# Patient Record
Sex: Female | Born: 1975 | Hispanic: No | Smoking: Never smoker
Health system: Southern US, Community
[De-identification: ages and names within clinical notes are randomized; demographics above are authoritative.]

## PROBLEM LIST (undated history)

## (undated) DIAGNOSIS — R87629 Unspecified abnormal cytological findings in specimens from vagina: Secondary | ICD-10-CM

## (undated) HISTORY — PX: SHOULDER SURGERY: SHX246

## (undated) HISTORY — DX: Unspecified abnormal cytological findings in specimens from vagina: R87.629

## (undated) HISTORY — PX: AUGMENTATION MAMMAPLASTY: SUR837

---

## 1999-10-15 ENCOUNTER — Emergency Department (HOSPITAL_COMMUNITY): Admission: EM | Admit: 1999-10-15 | Discharge: 1999-10-15 | Payer: Self-pay | Admitting: Emergency Medicine

## 2007-04-14 ENCOUNTER — Encounter: Admission: RE | Admit: 2007-04-14 | Discharge: 2007-04-14 | Payer: Self-pay | Admitting: Family Medicine

## 2010-09-27 ENCOUNTER — Other Ambulatory Visit (HOSPITAL_COMMUNITY): Payer: Self-pay | Admitting: Orthopedic Surgery

## 2010-09-27 DIAGNOSIS — M25512 Pain in left shoulder: Secondary | ICD-10-CM

## 2010-10-04 ENCOUNTER — Ambulatory Visit (HOSPITAL_COMMUNITY)
Admission: RE | Admit: 2010-10-04 | Discharge: 2010-10-04 | Disposition: A | Discharge status: Home / Self Care | Payer: Self-pay | Source: Ambulatory Visit | Attending: Orthopedic Surgery | Admitting: Orthopedic Surgery

## 2010-10-04 DIAGNOSIS — X58XXXA Exposure to other specified factors, initial encounter: Secondary | ICD-10-CM | POA: Insufficient documentation

## 2010-10-04 DIAGNOSIS — S46819A Strain of other muscles, fascia and tendons at shoulder and upper arm level, unspecified arm, initial encounter: Secondary | ICD-10-CM | POA: Insufficient documentation

## 2010-10-04 DIAGNOSIS — M25512 Pain in left shoulder: Secondary | ICD-10-CM

## 2010-10-04 DIAGNOSIS — M25519 Pain in unspecified shoulder: Secondary | ICD-10-CM | POA: Insufficient documentation

## 2010-10-04 DIAGNOSIS — M6250 Muscle wasting and atrophy, not elsewhere classified, unspecified site: Secondary | ICD-10-CM | POA: Insufficient documentation

## 2010-10-04 MED ORDER — IOHEXOL 300 MG/ML  SOLN
5.0000 mL | Freq: Once | INTRAMUSCULAR | Status: AC | PRN
  Administered 2010-10-04: 5 mL via INTRAVENOUS

## 2010-10-04 MED ORDER — GADOBENATE DIMEGLUMINE 529 MG/ML IV SOLN
0.0500 mL | Freq: Once | INTRAVENOUS | Status: AC | PRN
  Administered 2010-10-04: 0.05 mL via INTRAVENOUS

## 2011-01-18 ENCOUNTER — Ambulatory Visit (HOSPITAL_BASED_OUTPATIENT_CLINIC_OR_DEPARTMENT_OTHER)
Admission: RE | Admit: 2011-01-18 | Discharge: 2011-01-18 | Disposition: A | Discharge status: Home / Self Care | Payer: Self-pay | Source: Ambulatory Visit | Attending: Orthopedic Surgery | Admitting: Orthopedic Surgery

## 2011-01-18 DIAGNOSIS — M25819 Other specified joint disorders, unspecified shoulder: Secondary | ICD-10-CM | POA: Insufficient documentation

## 2011-01-18 DIAGNOSIS — M67919 Unspecified disorder of synovium and tendon, unspecified shoulder: Secondary | ICD-10-CM | POA: Insufficient documentation

## 2011-01-18 DIAGNOSIS — M719 Bursopathy, unspecified: Secondary | ICD-10-CM | POA: Insufficient documentation

## 2011-01-18 DIAGNOSIS — Z01812 Encounter for preprocedural laboratory examination: Secondary | ICD-10-CM | POA: Insufficient documentation

## 2011-01-18 LAB — POCT HEMOGLOBIN-HEMACUE: Hemoglobin: 15.6 g/dL — ABNORMAL HIGH (ref 12.0–15.0)

## 2011-01-30 NOTE — Op Note (Signed)
NAMEMarland Kitchen  PHUNG, KOTAS NO.:  [id number]  MEDICAL RECORD NO.:  [id number]  LOCATION:                                 FACILITY:  PHYSICIAN:  Elana Alm. Thurston Hole, M.D. DATE OF BIRTH:  15-Jan-1976  DATE OF PROCEDURE:  01/18/2011 DATE OF DISCHARGE:                              OPERATIVE REPORT   PREOPERATIVE DIAGNOSES: 1. Left shoulder rotator cuff tear. 2. Left shoulder partial labrum tear. 3. Left shoulder impingement.  POSTOPERATIVE DIAGNOSES: 1. Left shoulder rotator cuff tear. 2. Left shoulder partial labrum tear. 3. Left shoulder impingement.  PROCEDURE: 1. Left shoulder exam under anesthesia, followed by arthroscopically     assisted rotator cuff repair using Arthrex suture anchor x1 plus     PushLock anchor x1. 2. Left shoulder partial labrum debridement. 3. Left shoulder subacromial decompression.  SURGEON:  Elana Alm. Thurston Hole, MD  ASSISTANT:  Julien Girt, PA-C  ANESTHESIA:  General.OPERATIVE TIME:  1 hour.  COMPLICATIONS:  None.  INDICATIONS FOR PROCEDURE:  Ms. Thaker is a 35 year old woman who has had significant left shoulder pain for the past 6 months increasing in nature with exam and MRI documenting rotator cuff tear with partial labrum tear and impingement.  She has failed conservative care and is now to undergo arthroscopy and repair.  DESCRIPTION:  Ms. Rankin was brought to the operating room on January 18, 2011, after interscalene block was placed in the holding room by Anesthesia.  She was placed on the operative table in supine position. She received Ancef 1 g IV preoperatively for prophylaxis.  After being placed under general anesthesia, her left shoulder was examined.  She had full range of motion.  Her shoulder was stable with ligamentous exam.  She was then placed in beach-chair position.  Her shoulder and arm were prepped using sterile DuraPrep and draped using sterile technique.  Time-out procedure was called and the  correct left shoulder identified.  Initially through a posterior arthroscopic portal, the arthroscope with a pump attached was placed and through an anterior portal, an arthroscopic probe was placed.  On initial inspection, the articular cartilage and glenohumeral joint was intact.  She had a small partial tear 25% in the anterior labrum which was debrided.  The anterior-inferior labrum and anterior-inferior glenohumeral ligament complex was intact.  Biceps tendon anchor and biceps tendon was intact. Posterior labrum was intact.  Rotator cuff showed a significant tear of supraspinatus.  This was partially debrided arthroscopically.  The rest of the rotator cuff was intact.  Inferior capsular recess was free of pathology.  Subacromial space was entered and a lateral arthroscopic portal was made.  Moderately thickened bursitis was resected. Impingement was noted and a subacromial decompression was carried out removing 6-8 mm of the undersurface of the anterior, anterolateral, and anteromedial acromion and CA ligament release carried out as well.  The [facility] joint was not pathologic and thus was not resected.  At this point, the rotator cuff was repaired primarily through 2 accessory lateral portals, first with an Arthrex 5.5-mm suture anchor placed in the greater tuberosity.  Each of the sutures in this anchor were passed arthroscopically through the rotator cuff tear and then tied down with mattress suture  technique with firm and tight fixation.  Further fixation was then achieved with a Arthrex PushLock anchor in the lateral aspect of the greater tuberosity.  This completely repaired the rotator cuff down back with firm and tight fixation.  Shoulder could be brought through full range of motion with no impingement on the rotator cuff repair after this was done.  At this point, it was felt that all pathology had been satisfactorily addressed.  The instruments were removed.  Portals were closed  with 3-0 nylon suture.  Sterile dressings and a sling applied and the patient awakened and taken to recovery room in stable condition.  FOLLOWUP CARE:  Ms. Tritch will be followed as an outpatient on Percocet with early physical therapy.  I will see her back in the office in a week for sutures out and followup.     Jamilett Ferrante A. Thurston Hole, M.D.     RAW/MEDQ  D:  01/18/2011  T:  01/19/2011  Job:  [id number]  Electronically Signed by Salvatore Marvel M.D. on 01/30/2011 02:58:47 PM

## 2017-01-30 ENCOUNTER — Other Ambulatory Visit: Payer: Self-pay | Admitting: Obstetrics & Gynecology

## 2017-01-30 DIAGNOSIS — Z1231 Encounter for screening mammogram for malignant neoplasm of breast: Secondary | ICD-10-CM

## 2018-04-27 ENCOUNTER — Encounter (HOSPITAL_COMMUNITY): Payer: Self-pay

## 2018-05-02 ENCOUNTER — Telehealth (HOSPITAL_COMMUNITY): Payer: Self-pay | Admitting: Obstetrics and Gynecology

## 2018-05-02 NOTE — Telephone Encounter (Signed)
Called patient and left message regarding scheduling screening mammogram.  °

## 2018-05-11 ENCOUNTER — Other Ambulatory Visit (HOSPITAL_COMMUNITY): Payer: Self-pay | Admitting: *Deleted

## 2018-05-11 ENCOUNTER — Telehealth (HOSPITAL_COMMUNITY): Payer: Self-pay

## 2018-05-11 DIAGNOSIS — Z1231 Encounter for screening mammogram for malignant neoplasm of breast: Secondary | ICD-10-CM

## 2018-05-11 NOTE — Telephone Encounter (Signed)
Let message with patient to call back to get scheduled for a [facility] appointment.

## 2018-06-28 ENCOUNTER — Ambulatory Visit (HOSPITAL_COMMUNITY)
Admission: RE | Admit: 2018-06-28 | Discharge: 2018-06-28 | Disposition: A | Discharge status: Home / Self Care | Payer: Self-pay | Source: Ambulatory Visit | Attending: Obstetrics and Gynecology | Admitting: Obstetrics and Gynecology

## 2018-06-28 ENCOUNTER — Other Ambulatory Visit (HOSPITAL_COMMUNITY): Payer: Self-pay | Admitting: Obstetrics and Gynecology

## 2018-06-28 ENCOUNTER — Encounter (HOSPITAL_COMMUNITY): Payer: Self-pay

## 2018-06-28 ENCOUNTER — Ambulatory Visit
Admission: RE | Admit: 2018-06-28 | Discharge: 2018-06-28 | Disposition: A | Discharge status: Home / Self Care | Payer: No Typology Code available for payment source | Source: Ambulatory Visit | Attending: Obstetrics and Gynecology | Admitting: Obstetrics and Gynecology

## 2018-06-28 VITALS — BP 110/74 | Wt 147.0 lb

## 2018-06-28 DIAGNOSIS — Z1231 Encounter for screening mammogram for malignant neoplasm of breast: Secondary | ICD-10-CM

## 2018-06-28 DIAGNOSIS — R87610 Atypical squamous cells of undetermined significance on cytologic smear of cervix (ASC-US): Secondary | ICD-10-CM

## 2018-06-28 DIAGNOSIS — Z1239 Encounter for other screening for malignant neoplasm of breast: Secondary | ICD-10-CM

## 2018-06-28 DIAGNOSIS — R87810 Cervical high risk human papillomavirus (HPV) DNA test positive: Secondary | ICD-10-CM

## 2018-06-28 NOTE — Progress Notes (Signed)
Patient referred to [facility] by the [facility] Department due to having an abnormal Pap smear on 04/27/2018 that a colposcopy is recommended for follow-up.  Pap Smear: Pap smear not completed today. Last Pap smear was 04/27/2018 at the [facility] and ASCUS with positive HPV. Referred [facility] to the [facility] for a colposcopy to follow-up for her abnormal Pap smear. Appointment scheduled for Thursday, July 12, 2018 at 1355. Per patient has a history of an abnormal Pap smear when she was 61 or 43 years old that she thinks a colposcopy was completed for follow-up. Per patient all Pap smears have been normal since the colposcopy was completed. Last Pap smear result is in Epic.  Physical exam: Breasts Breasts symmetrical. No skin abnormalities bilateral breasts. No nipple retraction bilateral breasts. No nipple discharge bilateral breasts. No lymphadenopathy. No lumps palpated bilateral breasts. No complaints of pain or tenderness on exam. Referred patient to the [facility] for a screening mammogram. Appointment scheduled for Thursday, June 28, 2018 at 1440.        Pelvic/Bimanual No Pap smear completed today since last Pap smear and HPV typing was 04/27/2018. Pap smear not indicated per [facility] guidelines.   Smoking History: Patient has never smoked.  Patient Navigation: Patient education provided. Access to services provided for patient through [facility] program.   Breast and Cervical Cancer Risk Assessment: Patient has no family history of breast cancer, known genetic mutations, or radiation treatment to the chest before age 46. Per patient has a history of cervical dysplasia. Patient has no history of being immunocompromised or DES exposure in-utero.  Risk Assessment    Risk Scores      06/28/2018   Last edited by: Lynnell Dike, LPN   5-year risk: 0.6 %   Lifetime risk: 7.7 %

## 2018-06-28 NOTE — Patient Instructions (Signed)
Explained breast self awareness with Zeenat Dorce. Patient did not need a Pap smear today due to last Pap smear was 04/27/2018. Explained the colposcopy the recommended follow-up for her abnormal Pap smear. Referred [facility] to the [facility] for a colposcopy to follow-up for her abnormal Pap smear. Appointment scheduled for Thursday, July 12, 2018 at 1355. [facility]. Appointment scheduled for Thursday, June 28, 2018 at 1440. Patient aware of appointments and will be there. Let patient know the Breast Center will follow up with her within the next couple weeks with results of mammogram by letter or phone. Fabian Butt verbalized understanding.  Angeliki Mates, Kathaleen Maserhristine Poll, RN 2:09 PM

## 2018-07-02 ENCOUNTER — Encounter (HOSPITAL_COMMUNITY): Payer: Self-pay | Admitting: *Deleted

## 2018-07-12 ENCOUNTER — Ambulatory Visit (INDEPENDENT_AMBULATORY_CARE_PROVIDER_SITE_OTHER): Payer: Self-pay | Admitting: Obstetrics and Gynecology

## 2018-07-12 ENCOUNTER — Encounter: Payer: Self-pay | Admitting: Obstetrics and Gynecology

## 2018-07-12 ENCOUNTER — Other Ambulatory Visit (HOSPITAL_COMMUNITY)
Admission: RE | Admit: 2018-07-12 | Discharge: 2018-07-12 | Disposition: A | Discharge status: Home / Self Care | Payer: No Typology Code available for payment source | Source: Ambulatory Visit | Attending: Obstetrics and Gynecology | Admitting: Obstetrics and Gynecology

## 2018-07-12 DIAGNOSIS — N870 Mild cervical dysplasia: Secondary | ICD-10-CM

## 2018-07-12 DIAGNOSIS — R87610 Atypical squamous cells of undetermined significance on cytologic smear of cervix (ASC-US): Secondary | ICD-10-CM | POA: Insufficient documentation

## 2018-07-12 DIAGNOSIS — R87810 Cervical high risk human papillomavirus (HPV) DNA test positive: Secondary | ICD-10-CM | POA: Insufficient documentation

## 2018-07-12 LAB — POCT PREGNANCY, URINE: PREG TEST UR: NEGATIVE

## 2018-07-12 NOTE — Progress Notes (Signed)
Patient with ASCUS + HPV on 04/2018 pap smear here for colposcopy  Patient given informed consent, signed copy in the chart, time out was performed.  Placed in lithotomy position. Cervix viewed with speculum and colposcope after application of acetic acid.   Colposcopy adequate?  Yes  Acetowhite lesions? Yes 8 o'clock Punctation? no Mosaicism?  no Abnormal vasculature?  no Biopsies? 8 o'clock ECC? yes  COMMENTS:  Patient was given post procedure instructions.  She will return in 2 weeks for results. Also discussed the benefits of gardasil  Catalina Antigua, MD

## 2018-07-12 NOTE — Patient Instructions (Signed)
Colposcopy, Care After  This sheet gives you information about how to care for yourself after your procedure. Your doctor may also give you more specific instructions. If you have problems or questions, contact your doctor.  What can I expect after the procedure?  If you did not have a tissue sample removed (did not have a biopsy), you may only have some spotting for a few days. You can go back to your normal activities.  If you had a tissue sample removed, it is common to have:   Soreness and pain. This may last for a few days.   Light-headedness.   Mild bleeding from your vagina or dark-colored, grainy discharge from your vagina. This may last for a few days. You may need to wear a sanitary pad.   Spotting for at least 48 hours after the procedure.  Follow these instructions at home:     Take over-the-counter and prescription medicines only as told by your doctor. Ask your doctor what medicines you can start taking again. This is very important if you take blood-thinning medicine.   Do not drive or use heavy machinery while taking prescription pain medicine.   For 3 days, or as long as your doctor tells you, avoid:  ? Douching.  ? Using tampons.  ? Having sex.   If you use birth control (contraception), keep using it.   Limit activity for the first day after the procedure. Ask your doctor what activities are safe for you.   It is up to you to get the results of your procedure. Ask your doctor when your results will be ready.   Keep all follow-up visits as told by your doctor. This is important.  Contact a doctor if:   You get a skin rash.  Get help right away if:   You are bleeding a lot from your vagina. It is a lot of bleeding if you are using more than one pad an hour for 2 hours in a row.   You have clumps of blood (blood clots) coming from your vagina.   You have a fever.   You have chills   You have pain in your lower belly (pelvic area).   You have signs of infection, such as vaginal  discharge that is:  ? Different than usual.  ? Yellow.  ? Bad-smelling.   You have very pain or cramps in your lower belly that do not get better with medicine.   You feel light-headed.   You feel dizzy.   You pass out (faint).  Summary   If you did not have a tissue sample removed (did not have a biopsy), you may only have some spotting for a few days. You can go back to your normal activities.   If you had a tissue sample removed, it is common to have mild pain and spotting for 48 hours.   For 3 days, or as long as your doctor tells you, avoid douching, using tampons and having sex.   Get help right away if you have bleeding, very bad pain, or signs of infection.  This information is not intended to replace advice given to you by your health care provider. Make sure you discuss any questions you have with your health care provider.  Document Released: 11/23/2007 Document Revised: 02/24/2016 Document Reviewed: 02/24/2016  Elsevier Interactive Patient Education  2019 [employer]

## 2018-07-16 ENCOUNTER — Telehealth: Payer: Self-pay | Admitting: *Deleted

## 2018-07-16 NOTE — Telephone Encounter (Signed)
Called pt to inform her that her colpo results were consistent with her pap smear and the MD advised that she repeat her pap smear in 1 year.  Pt did not pick up.  Left voicemail informing pt she was being contacted regarding results and to contact the office to discuss those results.

## 2018-07-16 NOTE — Telephone Encounter (Signed)
-----   Message from Catalina Antigua, MD sent at 07/13/2018  4:52 PM EST ----- Please inform patient of colpo biospy consistent with pap smer. Plan is to repeat pap smear in 1 year

## 2018-07-16 NOTE — Telephone Encounter (Signed)
Returned pt's call regarding her results.  Pt informed that her colpo results were consistend with her pap smear and the MD advised that she repeat her pap smear in 1 year.  Pt verbalized understanding.

## 2020-09-13 IMAGING — MG DIGITAL SCREENING BILATERAL MAMMOGRAM WITH IMPLANTS, CAD AND TOM
8 of 12 series · 8 of 28 positions shown · non-contrast
Comparison: None.

CLINICAL DATA: Screening.

EXAM:
DIGITAL SCREENING BILATERAL MAMMOGRAM WITH IMPLANTS, CAD AND TOMO
The patient has retroglandular implants. Standard and implant
displaced views were performed.

[L MLO]
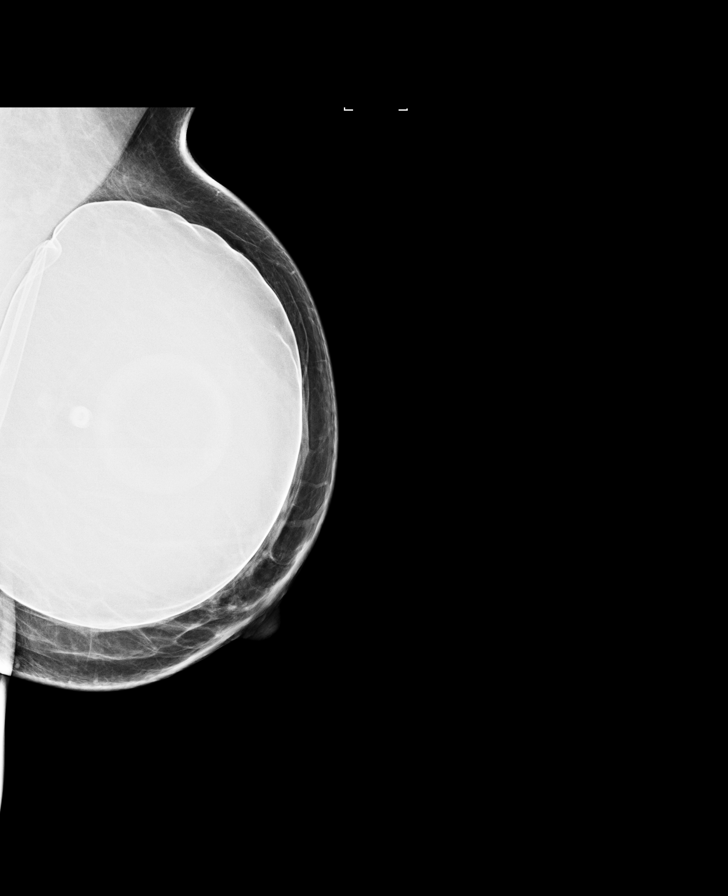

[R CC]
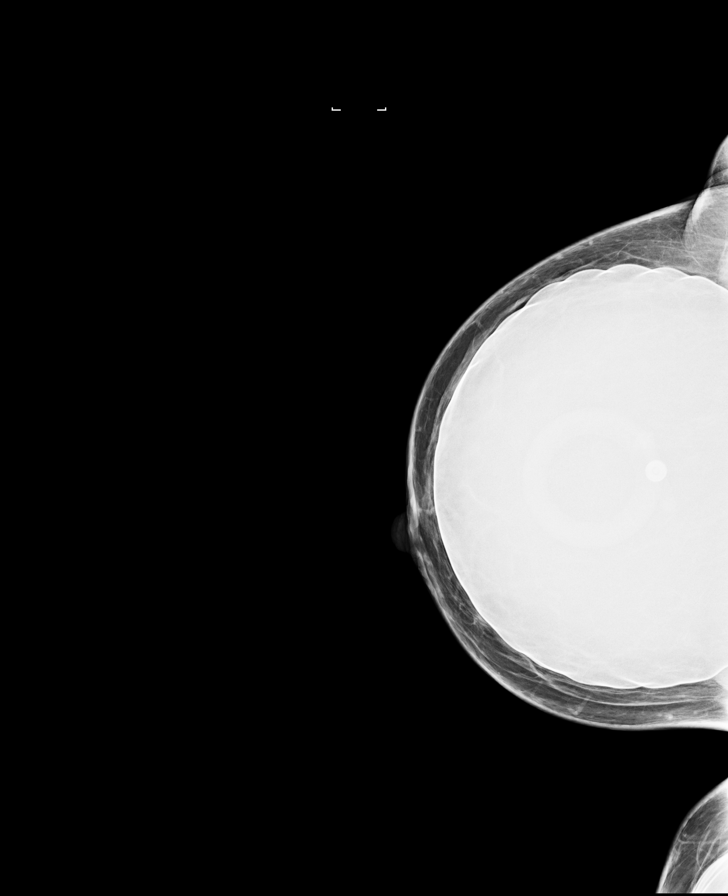

[L CC]
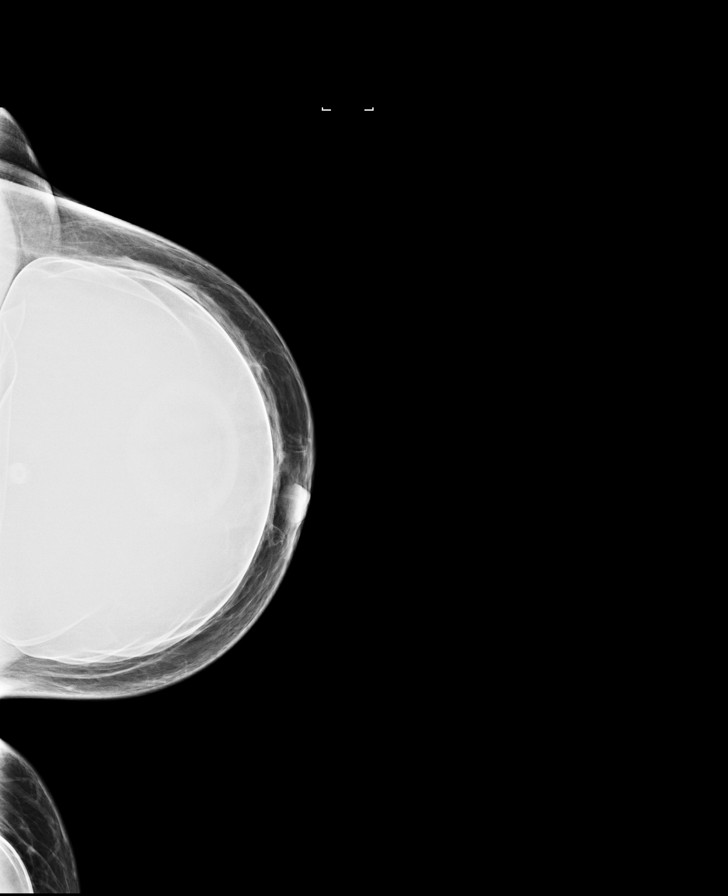

[R MLO]
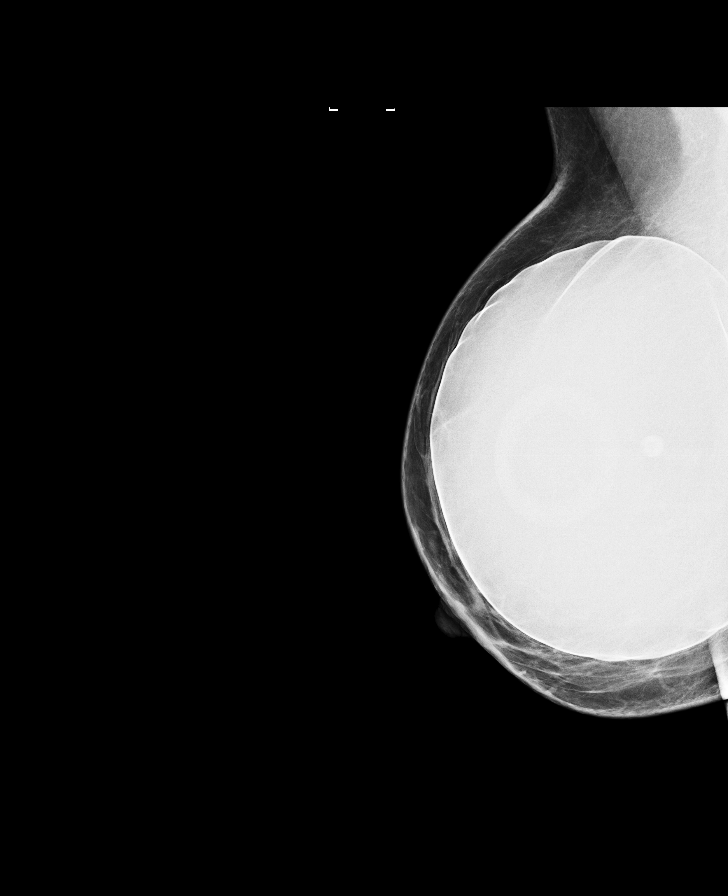

[L CC synth-2D]
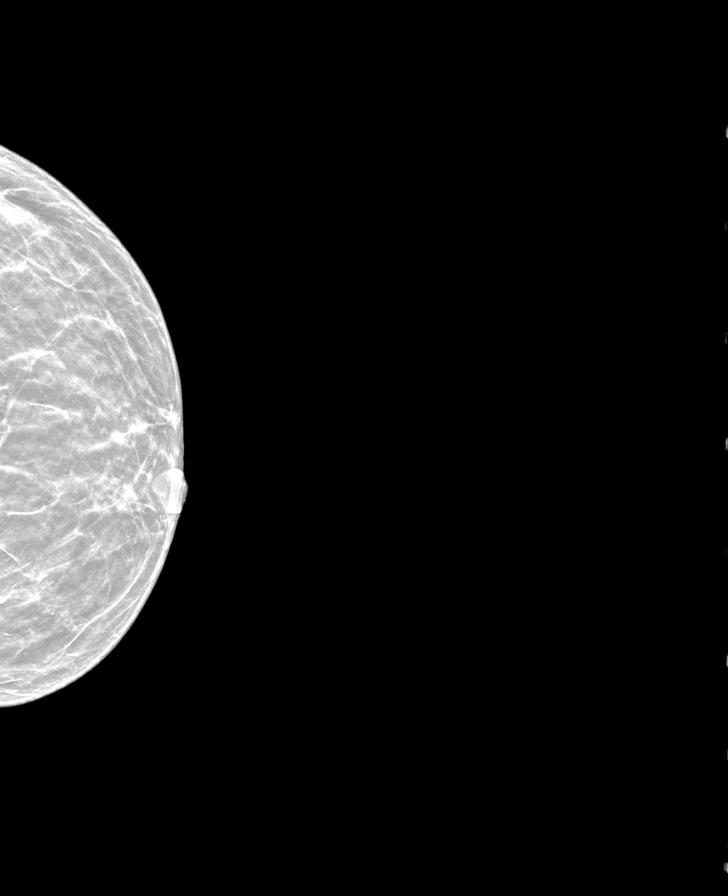

[L MLO synth-2D]
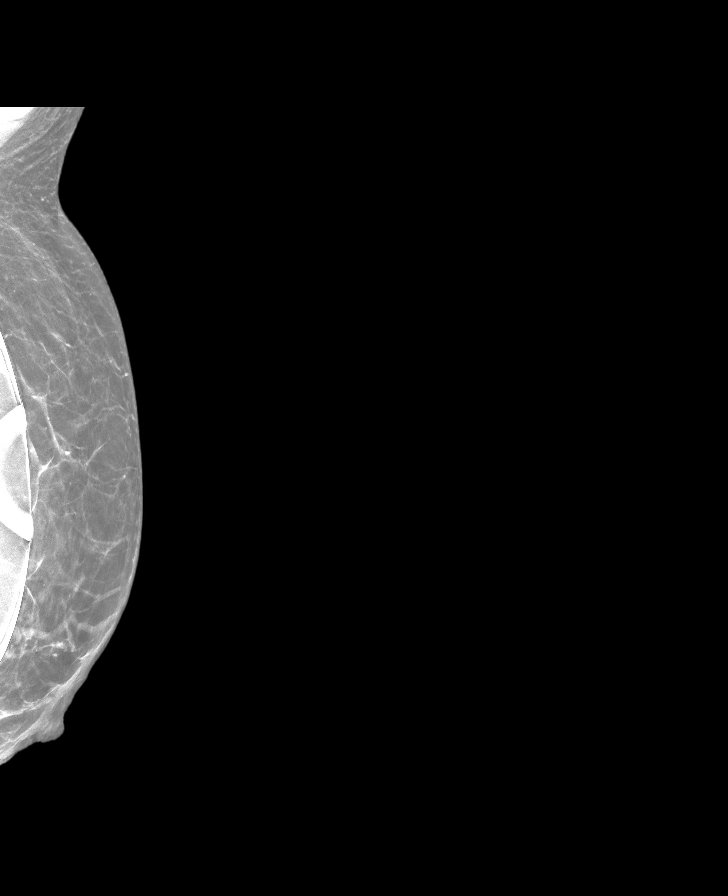

[R MLO synth-2D]
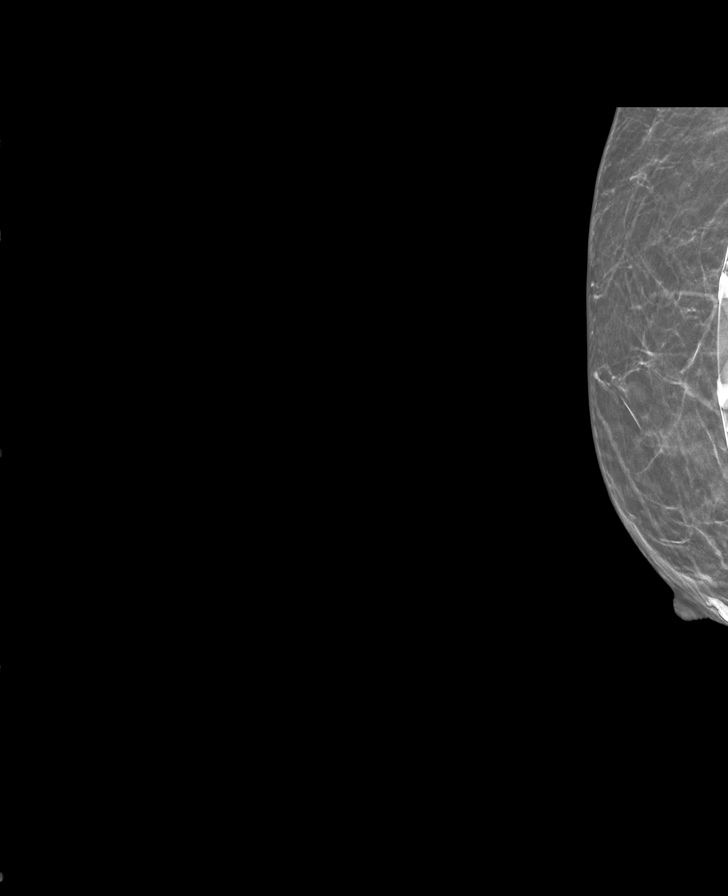

[R CC synth-2D]
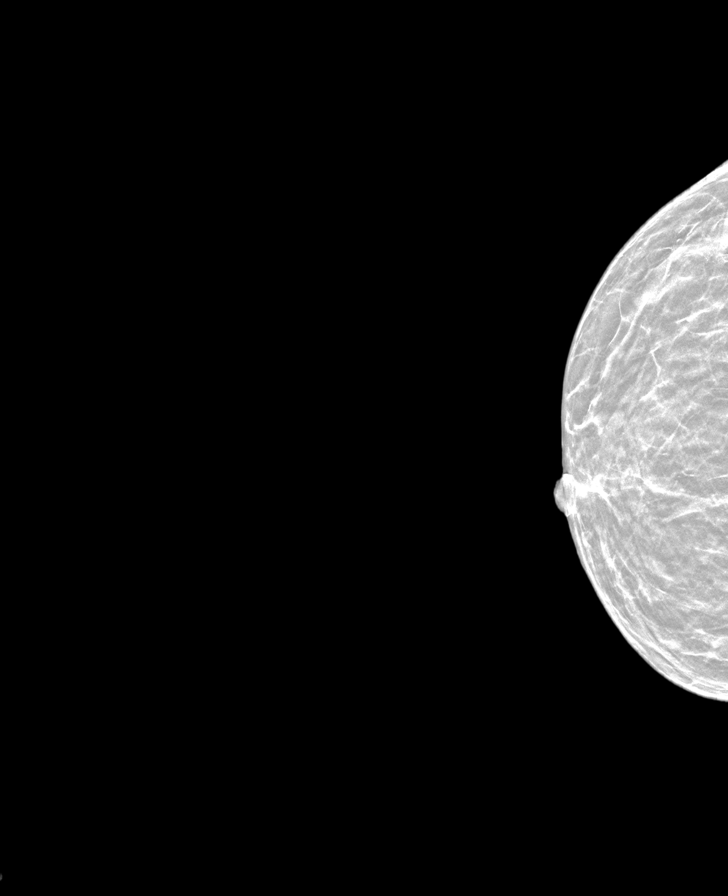

[8 of 28 positions shown; findings below may reference images not displayed]

ACR Breast Density Category b: There are scattered areas of
fibroglandular density.
FINDINGS: Bilateral retroglandular saline implants are intact.

There are no findings suspicious for malignancy. Images were
processed with CAD.
IMPRESSION: No mammographic evidence of malignancy. A result letter of this
screening mammogram will be mailed directly to the patient.

RECOMMENDATION:
Screening mammogram in one year. (Code:8M-5-YDU)

BI-RADS CATEGORY  1:  Negative.
